# Patient Record
Sex: Male | Born: 1985 | Race: White | Hispanic: No | Marital: Single | Smoking: Current some day smoker
Health system: Southern US, Community
[De-identification: ages and names within clinical notes are randomized; demographics above are authoritative.]

---

## 2008-10-23 ENCOUNTER — Ambulatory Visit: Payer: Self-pay | Admitting: Sports Medicine

## 2008-10-23 DIAGNOSIS — IMO0002 Reserved for concepts with insufficient information to code with codable children: Secondary | ICD-10-CM

## 2009-03-23 ENCOUNTER — Emergency Department (HOSPITAL_COMMUNITY): Admission: EM | Admit: 2009-03-23 | Discharge: 2009-03-23 | Payer: Self-pay | Admitting: Emergency Medicine

## 2010-05-11 LAB — URINE MICROSCOPIC-ADD ON

## 2010-05-11 LAB — URINALYSIS, ROUTINE W REFLEX MICROSCOPIC
Ketones, ur: 15 mg/dL — AB
Leukocytes, UA: NEGATIVE
Protein, ur: NEGATIVE mg/dL
Urobilinogen, UA: 0.2 mg/dL (ref 0.0–1.0)

## 2010-06-27 ENCOUNTER — Inpatient Hospital Stay (INDEPENDENT_AMBULATORY_CARE_PROVIDER_SITE_OTHER)
Admission: RE | Admit: 2010-06-27 | Discharge: 2010-06-27 | Disposition: A | Discharge status: Home / Self Care | Payer: 59 | Source: Ambulatory Visit | Attending: Family Medicine | Admitting: Family Medicine

## 2010-06-27 DIAGNOSIS — W19XXXA Unspecified fall, initial encounter: Secondary | ICD-10-CM

## 2010-06-27 DIAGNOSIS — S61409A Unspecified open wound of unspecified hand, initial encounter: Secondary | ICD-10-CM

## 2014-07-30 ENCOUNTER — Ambulatory Visit: Payer: Worker's Compensation | Admitting: Internal Medicine

## 2014-08-12 ENCOUNTER — Ambulatory Visit (INDEPENDENT_AMBULATORY_CARE_PROVIDER_SITE_OTHER): Payer: 59 | Admitting: Internal Medicine

## 2014-08-12 ENCOUNTER — Encounter: Payer: Self-pay | Admitting: Internal Medicine

## 2014-08-12 VITALS — BP 123/79 | HR 85 | Temp 97.8°F | Wt 204.0 lb

## 2014-08-12 DIAGNOSIS — M2550 Pain in unspecified joint: Secondary | ICD-10-CM | POA: Diagnosis not present

## 2014-08-12 LAB — CBC WITH DIFFERENTIAL/PLATELET
BASOS ABS: 0 10*3/uL (ref 0.0–0.1)
Basophils Relative: 0 % (ref 0–1)
EOS ABS: 0.4 10*3/uL (ref 0.0–0.7)
EOS PCT: 3 % (ref 0–5)
HEMATOCRIT: 45.8 % (ref 39.0–52.0)
Hemoglobin: 15.5 g/dL (ref 13.0–17.0)
Lymphocytes Relative: 25 % (ref 12–46)
Lymphs Abs: 3.1 10*3/uL (ref 0.7–4.0)
MCH: 29.6 pg (ref 26.0–34.0)
MCHC: 33.8 g/dL (ref 30.0–36.0)
MCV: 87.6 fL (ref 78.0–100.0)
MONO ABS: 0.9 10*3/uL (ref 0.1–1.0)
MONOS PCT: 7 % (ref 3–12)
MPV: 10.8 fL (ref 8.6–12.4)
Neutro Abs: 7.9 10*3/uL — ABNORMAL HIGH (ref 1.7–7.7)
Neutrophils Relative %: 65 % (ref 43–77)
Platelets: 307 10*3/uL (ref 150–400)
RBC: 5.23 MIL/uL (ref 4.22–5.81)
RDW: 13.3 % (ref 11.5–15.5)
WBC: 12.2 10*3/uL — ABNORMAL HIGH (ref 4.0–10.5)

## 2014-08-12 LAB — RHEUMATOID FACTOR: Rhuematoid fact SerPl-aCnc: <10 IU/mL (ref <=14)

## 2014-08-12 LAB — C-REACTIVE PROTEIN

## 2014-08-12 NOTE — Progress Notes (Signed)
Subjective:    Patient ID: Isaac Conner, male    DOB: [birth year], 29 y.o.   MRN: [id number]  HPI 28yo M with history of group A strep infection as a child that included recurrent strep throat, and rash. He was worked up at [facility] and also evaluated by rheumatology many years ago due to have polyarticular arthritis of hands mostly.At one time, he was on chronic antibiotics for one year which minimize events of having recurrent strep throat and denied having arthralgias at that time.   His pcp recently checked his ASO titer which was elevated and referred the patient to our clinic to see if they would benefit from being on antibiotics for chronic suppression again. He states that he gets 2 bouts of strep throat per year. Over the last year, he has predominantly had arthralgias, including possible right elbow bursitis. He is here with his father, who is a Development worker, community.  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 Spends much time outdoors. Possible tick exposure in the past. Allergies  Allergen Reactions  . Codeine Rash   No current outpatient prescriptions on file prior to visit.   No current facility-administered medications on file prior to visit.   Active Ambulatory Problems    Diagnosis Date Noted  . ELBOW SPRAIN, RIGHT 10/23/2008   Resolved Ambulatory Problems    Diagnosis Date Noted  . No Resolved Ambulatory Problems   No Additional Past Medical History  adhd Olecranon bursitis in feb 2016 Hx of pilonidal cyst, abscess  History  Substance Use Topics  . Smoking status: Current Some Day Smoker  . Smokeless tobacco: Never Used  . Alcohol Use: 0.0 oz/week    0 Standard drinks or equivalent per week    family history is not on file.  Review of Systems  Constitutional: Negative for fever, chills, diaphoresis, activity change, appetite change, fatigue and unexpected weight change.  HENT: Negative for  congestion, sore throat, rhinorrhea, sneezing, trouble swallowing and sinus pressure.  Eyes: Negative for photophobia and visual disturbance.  Respiratory: Negative for cough, chest tightness, shortness of breath, wheezing and stridor.  Cardiovascular: Negative for chest pain, palpitations and leg swelling.  Gastrointestinal: Negative for nausea, vomiting, abdominal pain, diarrhea, constipation, blood in stool, abdominal distention and anal bleeding.  Genitourinary: Negative for dysuria, hematuria, flank pain and difficulty urinating.  Musculoskeletal: + arthralgias Skin: Negative for color change, pallor, rash and wound.  Neurological: Negative for dizziness, tremors, weakness and light-headedness.  Hematological: Negative for adenopathy. Does not bruise/bleed easily.  Psychiatric/Behavioral: Negative for behavioral problems, confusion, sleep disturbance, dysphoric mood, decreased concentration and agitation.       Objective:   Physical Exam  BP 123/79 mmHg  Pulse 85  Temp(Src) 97.8 F (36.6 C) (Oral)  Wt 204 lb (92.534 kg) Physical Exam  Constitutional: He is oriented to person, place, and time. He appears well-developed and well-nourished. No distress.  HENT:  Mouth/Throat: Oropharynx is clear and moist. No oropharyngeal exudate.  Cardiovascular: Normal rate, regular rhythm and normal heart sounds. Exam reveals no gallop and no friction rub.  No murmur heard.  Pulmonary/Chest: Effort normal and breath sounds normal. No respiratory distress. He has no wheezes.  Abdominal: Soft. Bowel sounds are normal. He exhibits no distension. There is no tenderness.  Lymphadenopathy:  He has no cervical adenopathy.  Neurological: He is alert and oriented to person, place, and time.  Skin: Skin is warm and dry. No  rash noted. No erythema.  Psychiatric: He has a normal mood and affect. His behavior is normal.  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 Will check limited auto-immune work up  Hx of recurrent strep throat = unclear if arthritis is linked to his past hx of strep throat and that he has received treatment for strep throat as well as nsaids for joint pain that has not worked. Providing chronic antibiotics for this presentation that is not completely clear if related to Group A strep remains controversial. AT this time i think it makes sense for [place] to still pursue further work up through adult rheumatology to ensure that we are not missing other processes.  Health promotion = will check hiv ab

## 2014-08-13 LAB — ANA: ANA: NEGATIVE

## 2014-08-13 LAB — SEDIMENTATION RATE: SED RATE: 4 mm/h (ref 0–15)

## 2014-08-13 LAB — HIV ANTIBODY (ROUTINE TESTING W REFLEX): HIV 1&2 Ab, 4th Generation: NONREACTIVE

## 2014-08-16 LAB — B. BURGDORFI ANTIBODIES BY WB
B BURGDORFERI IGG ABS (IB): NEGATIVE
B BURGDORFERI IGM ABS (IB): NEGATIVE

## 2014-08-19 ENCOUNTER — Telehealth: Payer: Self-pay | Admitting: Licensed Clinical Social Worker

## 2014-08-19 NOTE — Telephone Encounter (Signed)
Patient's mother called wanting the results of the recent labs, results given. Mom states that it will be late September or early October before he can get into Rheumatology.  Mother wanted to know when would Dr. Drue SecondSnider like for the patient to come back and if there is any more recommended testing that could be done. Please advise

## 2014-08-19 NOTE — Telephone Encounter (Signed)
Spoke with patient's mother. I gave her the results. She stated that Dr. Nicholos Johnseade referred the patient to Rheumatology, and they are looking over notes to see when they can get him in. Mother states it may be September or October.

## 2014-08-19 NOTE — Telephone Encounter (Signed)
i attempted to call patient on Friday but could not leave voicemail since it is full. Will refer him to rheumatology. Can you give them a call to let them know lyme testing is negative. I don'lt have confidence that Giving antibiotics will be helpful useful for his arthritis and probably should see rheumatolgoy. Or can you let parents know to have their son call me at [phone].

## 2014-08-22 ENCOUNTER — Telehealth: Payer: Self-pay | Admitting: *Deleted

## 2014-08-22 NOTE — Telephone Encounter (Signed)
Received signed release of information request from [facility]. Patient has been seen once. RN faxed the office note to [phone]. Andree CossHowell, Romelo Sciandra M, RN

## 2014-09-04 ENCOUNTER — Other Ambulatory Visit: Payer: Self-pay | Admitting: Pain Medicine

## 2014-09-04 DIAGNOSIS — R52 Pain, unspecified: Secondary | ICD-10-CM

## 2014-09-05 ENCOUNTER — Telehealth: Payer: Self-pay | Admitting: *Deleted

## 2014-09-05 NOTE — Telephone Encounter (Signed)
RN spoke with the mother who is very upset about her son's symptoms.  She and her husband are doing all that they can do to make the pt comfortable but nothing is working.  The mother has been educated as a physical therapist and she stated that the pt's joint swelling is the worst she has encountered.  After that mother had an opportunity to share the pt's symptoms and her concerns she decided to call Dr. Fulton Moleobert Reed, the pt's primary care physician today.  The pt's pain management MD is Dr. Jordan LikesSpivey at [facility].

## 2014-09-09 ENCOUNTER — Ambulatory Visit (INDEPENDENT_AMBULATORY_CARE_PROVIDER_SITE_OTHER): Payer: 59 | Admitting: Internal Medicine

## 2014-09-09 VITALS — BP 116/78 | HR 96 | Temp 98.0°F | Resp 14 | Ht 76.0 in | Wt 207.0 lb

## 2014-09-09 DIAGNOSIS — R5081 Fever presenting with conditions classified elsewhere: Secondary | ICD-10-CM

## 2014-09-09 DIAGNOSIS — M2550 Pain in unspecified joint: Secondary | ICD-10-CM

## 2014-09-09 NOTE — Progress Notes (Signed)
  RFV: fevers and polyarthralgia Subjective:    Patient ID: Isaac FilbertDerek Terrance, male    DOB: Jun 17, 1985, 29 y.o.   MRN: [id number]  HPI 29yo M with history of childhood group a strep infections, who is concerned that he is having episodic fevers, he took temp of 102F, with concurrent swelling, pain of knees, R > L. These episodes last roughly 2 days. Since we last saw him,  He states that he has had a few episodes, with the most severe this past week. He doesn't take any ibuprofen or tylenol for it. He denies any sore throat or rash. He still has slight left knee effusion. He has upcoming appt on the 29th with dr. Titus Dubinevashwar. Also has mri of knees coming up this week.  At last visit, his inflammatory markers, inflammatory markers, lyme ab all were negative  Allergies  Allergen Reactions  . Codeine Rash   No current outpatient prescriptions on file prior to visit.   No current facility-administered medications on file prior to visit.   Active Ambulatory Problems    Diagnosis Date Noted  . ELBOW SPRAIN, RIGHT 10/23/2008   Resolved Ambulatory Problems    Diagnosis Date Noted  . No Resolved Ambulatory Problems   No Additional Past Medical History      Review of Systems  Constitutional: positive for fever, but no chills, diaphoresis, activity change, appetite change, fatigue and unexpected weight change.  HENT: Negative for congestion, sore throat, rhinorrhea, sneezing, trouble swallowing and sinus pressure.  Eyes: Negative for photophobia and visual disturbance.  Respiratory: Negative for cough, chest tightness, shortness of breath, wheezing and stridor.  Cardiovascular: Negative for chest pain, palpitations and leg swelling.  Gastrointestinal: Negative for nausea, vomiting, abdominal pain, diarrhea, constipation, blood in stool, abdominal distention and anal bleeding.  Genitourinary: Negative for dysuria, hematuria, flank pain and difficulty urinating.  Musculoskeletal: Positive for  joint swelling of knees. Negative for myalgias, back pain,  Skin: Negative for color change, pallor, rash and wound.  Neurological: Negative for dizziness, tremors, weakness and light-headedness.  Hematological: Negative for adenopathy. Does not bruise/bleed easily.  Psychiatric/Behavioral: Negative for behavioral problems, confusion, sleep disturbance, dysphoric mood, decreased concentration and agitation.       Objective:   Physical Exam BP 116/78 mmHg  Pulse 96  Temp(Src) 98 F (36.7 C) (Oral)  Resp 14  Ht 6\' 4"  (1.93 m)  Wt 207 lb (93.895 kg)  BMI 25.21 kg/m2 Physical Exam  Constitutional: He is oriented to person, place, and time. He appears well-developed and well-nourished. No distress.  Neurological: He is alert and oriented to person, place, and time.  Skin: Skin is warm and dry. No rash noted. No erythema.  Ext: left knee slight effusion no warmth, full range of motion Psychiatric: He has a normal mood and affect. His behavior is normal.    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 Rheumatology process. unclear if this is related to infectious disease process. We will have him do a fever curve for the next 2-3 wk. Await to see what further work up is done through rheumatology. Will review results of mri and discuss further work up after he sees Dr. Titus Dubinevashwar.

## 2014-09-10 ENCOUNTER — Ambulatory Visit
Admission: RE | Admit: 2014-09-10 | Discharge: 2014-09-10 | Disposition: A | Discharge status: Home / Self Care | Payer: 59 | Source: Ambulatory Visit | Attending: Pain Medicine | Admitting: Pain Medicine

## 2014-09-10 DIAGNOSIS — R52 Pain, unspecified: Secondary | ICD-10-CM

## 2014-09-10 NOTE — Telephone Encounter (Signed)
Saw in clinic on 7/18

## 2014-10-02 ENCOUNTER — Ambulatory Visit (HOSPITAL_COMMUNITY)
Admission: RE | Admit: 2014-10-02 | Discharge: 2014-10-02 | Disposition: A | Discharge status: Home / Self Care | Payer: 59 | Source: Ambulatory Visit | Attending: Rheumatology | Admitting: Rheumatology

## 2014-10-02 ENCOUNTER — Other Ambulatory Visit (HOSPITAL_COMMUNITY): Payer: Self-pay | Admitting: Rheumatology

## 2014-10-02 DIAGNOSIS — Z9225 Personal history of immunosupression therapy: Secondary | ICD-10-CM

## 2016-11-16 IMAGING — MR MR KNEE*R* W/O CM
6 series · 40 of 40 positions shown · non-contrast
Comparison: None.

CLINICAL DATA: With right knee pain for approximately 1 year which
is worse with inactivity. Pain is predominantly medial and about the
inferior patella.

EXAM:
MRI OF THE RIGHT KNEE WITHOUT CONTRAST
TECHNIQUE: Multiplanar, multisequence MR imaging of the knee was performed. No
intravenous contrast was administered.

[Series 3: PD · axial · 4.0mm · 0.50mm/px · z∈[-46,+74]mm · 8 of 26 slices shown (1 of 2)]
[im 1/26]
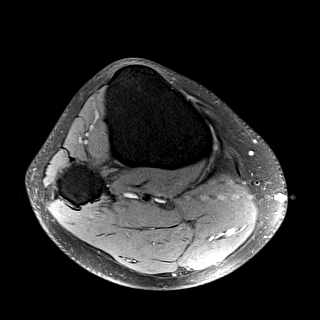
[im 4/26]
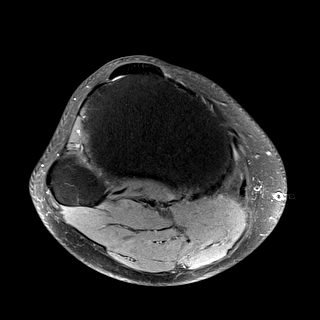
[im 8/26]
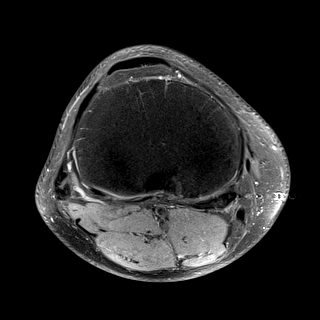
[im 11/26]
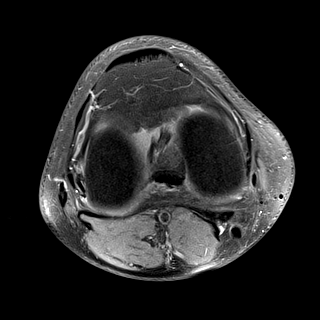
[im 15/26]
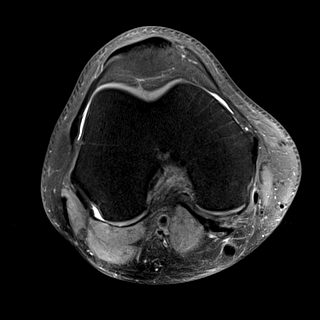
[im 18/26]
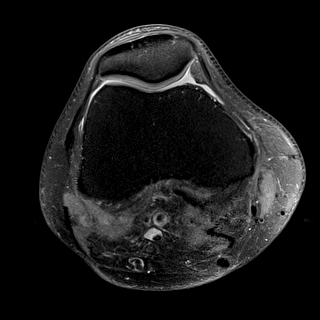
[im 22/26]
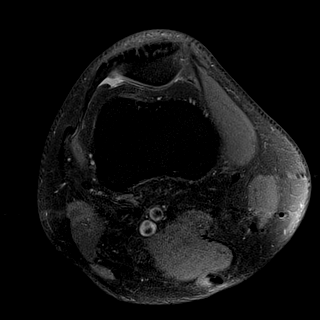
[im 26/26]
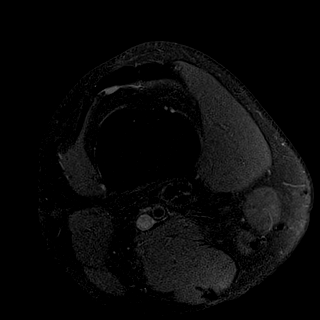

[Series 4: PD fat-sat · sagittal · 4.0mm · 0.50mm/px · 8 of 25 slices shown (1 of 2)]
[im 1/25]
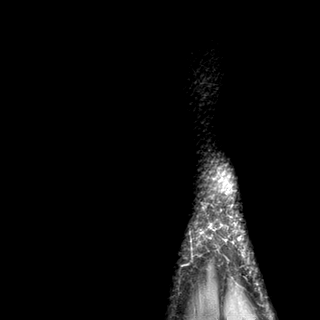
[im 4/25]
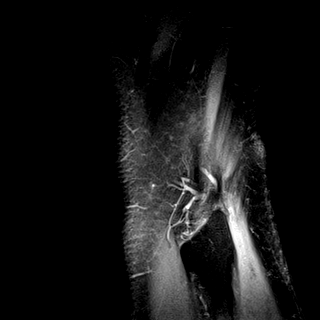
[im 7/25]
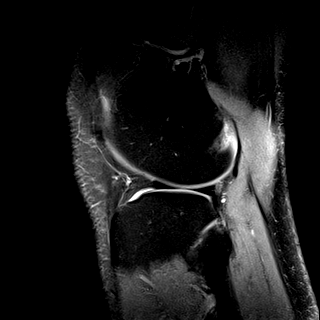
[im 11/25]
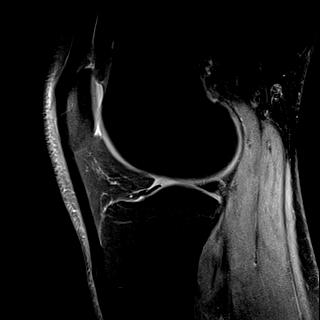
[im 14/25]
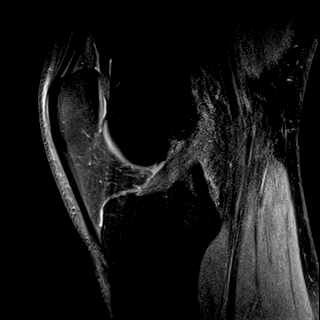
[im 18/25]
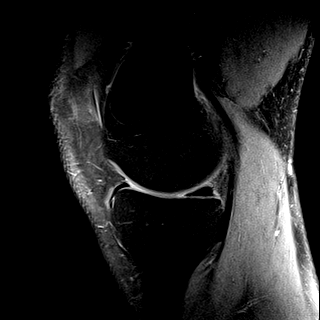
[im 21/25]
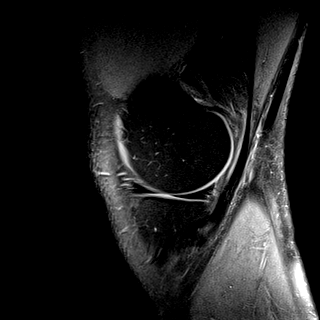
[im 25/25]
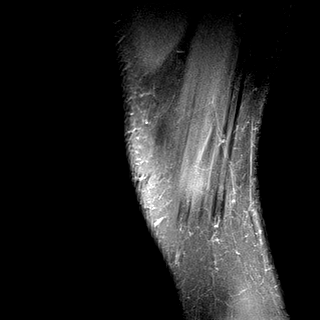

[Series 5: PD · coronal · 4.0mm · 0.53mm/px · 7 of 22 slices shown (2 of 2)]
[im 1/22]
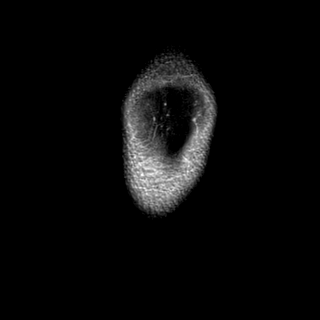
[im 4/22]
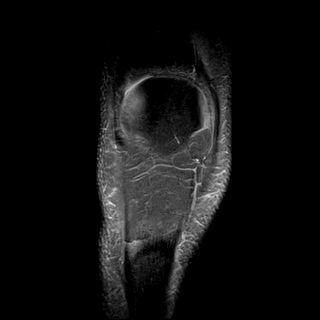
[im 8/22]
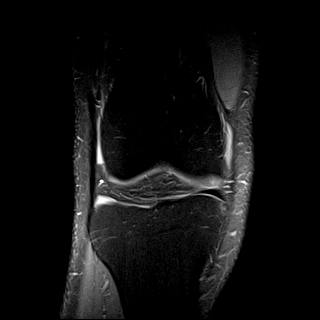
[im 11/22]
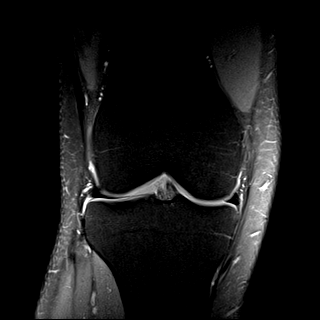
[im 15/22]
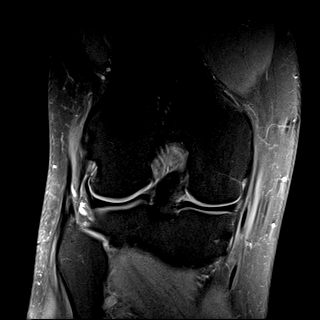
[im 18/22]
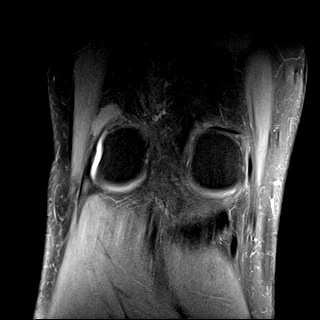
[im 22/22]
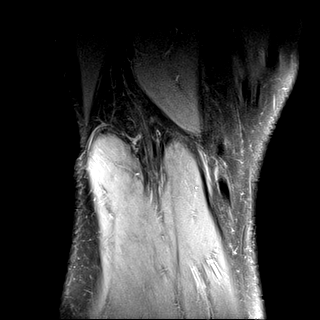

[Series 6: right (id) · coronal · 4.0mm · 0.53mm/px · 7 of 22 slices shown]
[im 1/22]
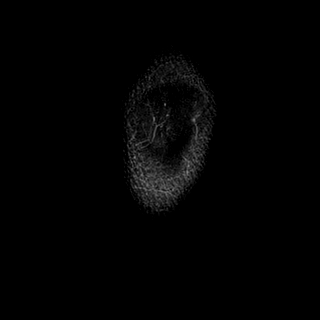
[im 4/22]
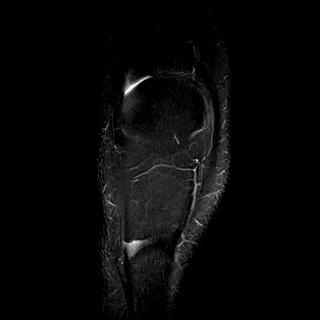
[im 8/22]
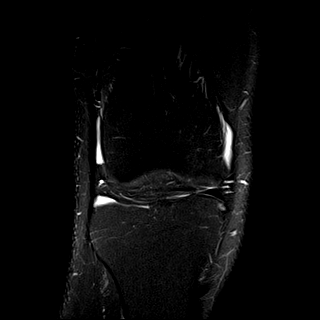
[im 11/22]
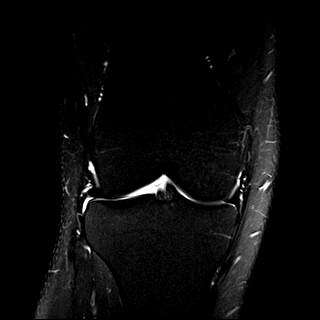
[im 15/22]
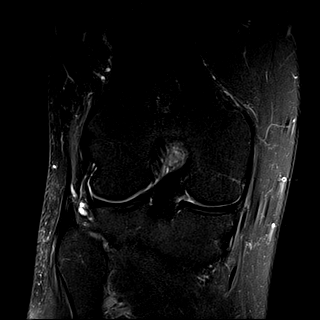
[im 18/22]
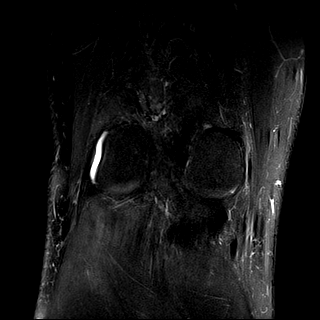
[im 22/22]
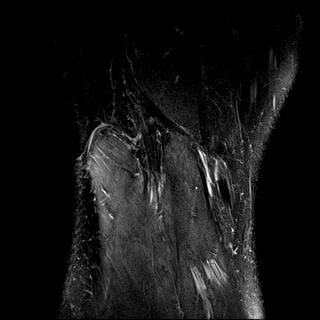

[Series 7: T1 · coronal · 4.0mm · 0.53mm/px · 7 of 22 slices shown]
[im 1/22]
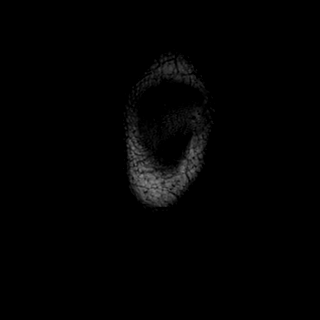
[im 4/22]
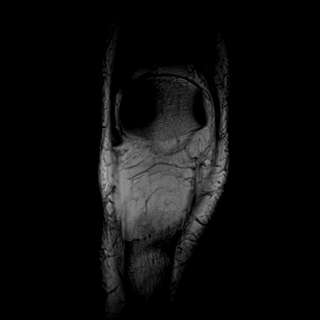
[im 8/22]
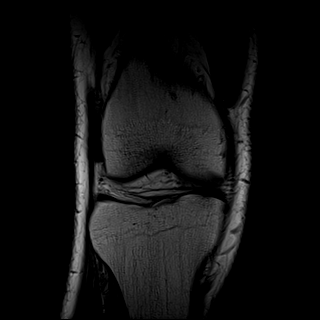
[im 11/22]
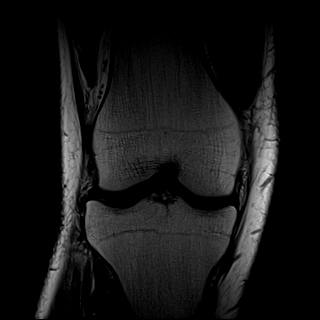
[im 15/22]
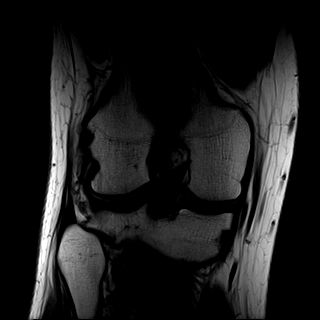
[im 18/22]
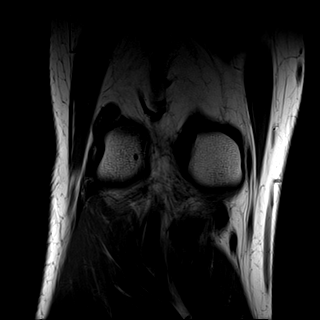
[im 22/22]
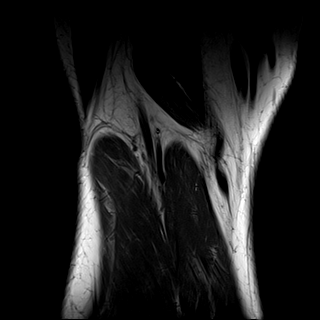

[Series 8: PD fat-sat · oblique · 2.0mm · 0.59mm/px · 3 of 11 slices shown (2 of 2)]
[im 1/11]
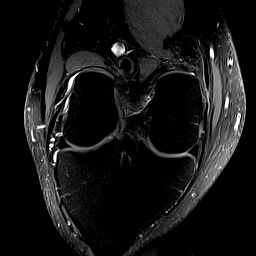
[im 6/11]
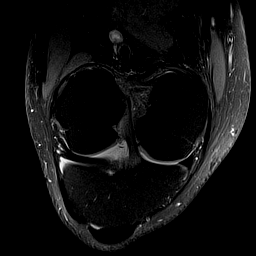
[im 11/11]
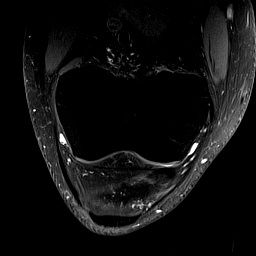

[40 of 40 positions shown; findings below may reference images not displayed]

FINDINGS: MENISCI

Medial meniscus:  Intact.

Lateral meniscus:  Intact.

LIGAMENTS

Cruciates: Intact. Mucoid degeneration of anterior cruciate ligament
is noted.

Collaterals:  Intact.

CARTILAGE

Patellofemoral:  Normal.

Medial:  Normal.

Lateral:  Normal.

Joint:  Trace amount of joint fluid is seen.

Popliteal Fossa:  No Baker's cyst.

Extensor Mechanism:  Intact and normal in appearance.

Bones:  Normal marrow signal throughout.
IMPRESSION: Negative for meniscal or ligament tear. No finding to explain the
patient's symptoms. Mild appearing mucoid degeneration of the
anterior cruciate ligament is noted.
